# Patient Record
Sex: Male | Born: 1960 | Race: Black or African American | Hispanic: No | Marital: Married | Smoking: Current every day smoker
Health system: Southern US, Community
[De-identification: ages and names within clinical notes are randomized; demographics above are authoritative.]

---

## 1999-10-28 ENCOUNTER — Emergency Department (HOSPITAL_COMMUNITY): Admission: EM | Admit: 1999-10-28 | Discharge: 1999-10-28 | Payer: Self-pay

## 2015-12-30 ENCOUNTER — Ambulatory Visit (INDEPENDENT_AMBULATORY_CARE_PROVIDER_SITE_OTHER): Payer: BLUE CROSS/BLUE SHIELD | Admitting: Sports Medicine

## 2015-12-30 ENCOUNTER — Ambulatory Visit (INDEPENDENT_AMBULATORY_CARE_PROVIDER_SITE_OTHER): Payer: BLUE CROSS/BLUE SHIELD

## 2015-12-30 ENCOUNTER — Encounter: Payer: Self-pay | Admitting: Sports Medicine

## 2015-12-30 VITALS — BP 125/74 | HR 106 | Resp 16 | Wt 208.0 lb

## 2015-12-30 DIAGNOSIS — S82142A Displaced bicondylar fracture of left tibia, initial encounter for closed fracture: Secondary | ICD-10-CM

## 2015-12-30 DIAGNOSIS — S82145A Nondisplaced bicondylar fracture of left tibia, initial encounter for closed fracture: Secondary | ICD-10-CM | POA: Diagnosis not present

## 2015-12-30 DIAGNOSIS — X58XXXA Exposure to other specified factors, initial encounter: Secondary | ICD-10-CM

## 2015-12-30 DIAGNOSIS — M25462 Effusion, left knee: Secondary | ICD-10-CM

## 2015-12-30 DIAGNOSIS — M25561 Pain in right knee: Secondary | ICD-10-CM

## 2015-12-30 MED ORDER — MELOXICAM 15 MG PO TABS
ORAL_TABLET | ORAL | Status: DC
Start: 2015-12-30 — End: 2016-05-08

## 2015-12-30 MED ORDER — AMBULATORY NON FORMULARY MEDICATION: Status: AC

## 2015-12-30 NOTE — Assessment & Plan Note (Addendum)
After an injury while deep see fishing. I am unable to get solid endpoints on his LCL, anterior cruciate ligament, PCL. Aspiration and injection, x-rays, return in one month. Meloxicam.  Noted tibial plateau fracture, advised against weight-bearing, he will get crutches and a rolling knee scooter.

## 2015-12-30 NOTE — Progress Notes (Signed)
   Subjective:    I'm seeing this patient as a consultation for:  Dr. Pecolia Adesimothy Dalton  CC: Left knee swelling  HPI: This is a pleasant 55 year old male, he went on a deep sea fishing trip and injured his knee, accidentally hit it on something and thinks he may hyperextended it. He had immediate pain, severe swelling. Right now pain is predominantly at the medial and lateral joint lines, he does have a sense of instability. Pain is moderate, persistent.  Past medical history, Surgical history, Family history not pertinant except as noted below, Social history, Allergies, and medications have been entered into the medical record, reviewed, and no changes needed.   Review of Systems: No headache, visual changes, nausea, vomiting, diarrhea, constipation, dizziness, abdominal pain, skin rash, fevers, chills, night sweats, weight loss, swollen lymph nodes, body aches, joint swelling, muscle aches, chest pain, shortness of breath, mood changes, visual or auditory hallucinations.   Objective:   General: Well Developed, well nourished, and in no acute distress.  Neuro/Psych: Alert and oriented x3, extra-ocular muscles intact, able to move all 4 extremities, sensation grossly intact. Skin: Warm and dry, no rashes noted.  Respiratory: Not using accessory muscles, speaking in full sentences, trachea midline.  Cardiovascular: Pulses palpable, no extremity edema. Abdomen: Does not appear distended. Left Knee: Visibly swollen with a palpable effusion and tenderness at the medial and lateral joint lines ROM normal in flexion and extension and lower leg rotation. Ligaments with solid consistent endpoints including the only ligament I can get a good endpoint for is the MCL. Negative Mcmurray's and provocative meniscal tests. Non painful patellar compression. Patellar and quadriceps tendons unremarkable. Hamstring and quadriceps strength is normal.  Procedure: Real-time Ultrasound Guided  aspiration/Injection of left knee Device: GE Logiq E  Verbal informed consent obtained.  Time-out conducted.  Noted no overlying erythema, induration, or other signs of local infection.  Skin prepped in a sterile fashion.  Local anesthesia: Topical Ethyl chloride.  With sterile technique and under real time ultrasound guidance: Aspirated 57 mL frank blood, syringe switched and 1 mL kenalog 40, 2 mL lidocaine, 2 mL Marcaine injected easily.  Completed without difficulty  Pain immediately resolved suggesting accurate placement of the medication.  Advised to call if fevers/chills, erythema, induration, drainage, or persistent bleeding.  Images permanently stored and available for review in the ultrasound unit.  Impression: Technically successful ultrasound guided injection.  Impression and Recommendations:   This case required medical decision making of moderate complexity.

## 2016-01-15 ENCOUNTER — Encounter: Payer: Self-pay | Admitting: Sports Medicine

## 2016-01-27 ENCOUNTER — Encounter: Payer: Self-pay | Admitting: Sports Medicine

## 2016-01-27 ENCOUNTER — Ambulatory Visit (INDEPENDENT_AMBULATORY_CARE_PROVIDER_SITE_OTHER): Payer: BLUE CROSS/BLUE SHIELD

## 2016-01-27 ENCOUNTER — Ambulatory Visit (INDEPENDENT_AMBULATORY_CARE_PROVIDER_SITE_OTHER): Payer: BLUE CROSS/BLUE SHIELD | Admitting: Sports Medicine

## 2016-01-27 DIAGNOSIS — S82142D Displaced bicondylar fracture of left tibia, subsequent encounter for closed fracture with routine healing: Secondary | ICD-10-CM

## 2016-01-27 DIAGNOSIS — S82142A Displaced bicondylar fracture of left tibia, initial encounter for closed fracture: Secondary | ICD-10-CM

## 2016-01-27 DIAGNOSIS — W2203XD Walked into furniture, subsequent encounter: Secondary | ICD-10-CM

## 2016-01-27 NOTE — Progress Notes (Signed)
  Subjective:    CC: Follow-up  HPI: Left tibial plateau fracture: Improving significantly after initial injury. Can weight bear with only minimal discomfort.  Past medical history, Surgical history, Family history not pertinant except as noted below, Social history, Allergies, and medications have been entered into the medical record, reviewed, and no changes needed.   Review of Systems: No fevers, chills, night sweats, weight loss, chest pain, or shortness of breath.   Objective:    General: Well Developed, well nourished, and in no acute distress.  Neuro: Alert and oriented x3, extra-ocular muscles intact, sensation grossly intact.  HEENT: Normocephalic, atraumatic, pupils equal round reactive to light, neck supple, no masses, no lymphadenopathy, thyroid nonpalpable.  Skin: Warm and dry, no rashes. Cardiac: Regular rate and rhythm, no murmurs rubs or gallops, no lower extremity edema.  Respiratory: Clear to auscultation bilaterally. Not using accessory muscles, speaking in full sentences. Left knee: Still with some minor effusion, only minimal tenderness at the lateral tibial plateau, some laxity to the MCL and LCL.  Impression and Recommendations:    I spent 25 minutes with this patient, greater than 50% was face-to-face time counseling regarding the above diagnoses

## 2016-01-27 NOTE — Assessment & Plan Note (Signed)
1 month post tibial plateau fracture, lateral. We did place some steroid in his joint at the last visit. Repeat x-rays today, return to see me in one month. Continue brace, out of work for another month.

## 2016-02-24 ENCOUNTER — Ambulatory Visit: Payer: BLUE CROSS/BLUE SHIELD | Admitting: Sports Medicine

## 2016-02-25 ENCOUNTER — Encounter: Payer: Self-pay | Admitting: Sports Medicine

## 2016-02-25 ENCOUNTER — Ambulatory Visit (INDEPENDENT_AMBULATORY_CARE_PROVIDER_SITE_OTHER): Payer: BLUE CROSS/BLUE SHIELD | Admitting: Sports Medicine

## 2016-02-25 ENCOUNTER — Ambulatory Visit (INDEPENDENT_AMBULATORY_CARE_PROVIDER_SITE_OTHER): Payer: BLUE CROSS/BLUE SHIELD

## 2016-02-25 DIAGNOSIS — S82142D Displaced bicondylar fracture of left tibia, subsequent encounter for closed fracture with routine healing: Secondary | ICD-10-CM

## 2016-02-25 DIAGNOSIS — X58XXXD Exposure to other specified factors, subsequent encounter: Secondary | ICD-10-CM

## 2016-02-25 DIAGNOSIS — M1711 Unilateral primary osteoarthritis, right knee: Secondary | ICD-10-CM

## 2016-02-25 NOTE — Progress Notes (Signed)
  Subjective:    CC: Follow-up  HPI: Just over 6 weeks post left tibial plateau fracture, doing better, and continues to improve.  Past medical history, Surgical history, Family history not pertinant except as noted below, Social history, Allergies, and medications have been entered into the medical record, reviewed, and no changes needed.   Review of Systems: No fevers, chills, night sweats, weight loss, chest pain, or shortness of breath.   Objective:    General: Well Developed, well nourished, and in no acute distress.  Neuro: Alert and oriented x3, extra-ocular muscles intact, sensation grossly intact.  HEENT: Normocephalic, atraumatic, pupils equal round reactive to light, neck supple, no masses, no lymphadenopathy, thyroid nonpalpable.  Skin: Warm and dry, no rashes. Cardiac: Regular rate and rhythm, no murmurs rubs or gallops, no lower extremity edema.  Respiratory: Clear to auscultation bilaterally. Not using accessory muscles, speaking in full sentences. Left Knee: Normal to inspection with no erythema or effusion or obvious bony abnormalities. Still with a bit of tenderness over the medial tibial plateau ROM normal in flexion and extension and lower leg rotation. Ligaments with solid consistent endpoints including ACL, PCL, LCL, MCL. Negative Mcmurray's and provocative meniscal tests. Non painful patellar compression. Patellar and quadriceps tendons unremarkable. Hamstring and quadriceps strength is normal.  Impression and Recommendations:    I spent 25 minutes with this patient, greater than 50% was face-to-face time counseling regarding the above diagnoses

## 2016-02-25 NOTE — Assessment & Plan Note (Signed)
Continues to improve. Still has some pain, and a bit of varus instability. Repeat x-rays today, we did discuss the likelihood of post traumatic osteoarthritis. Continue brace. Continue meloxicam. Return to see me in one month, out of work for 2 additional weeks. If persistent pain we will proceed with MRI

## 2016-03-24 ENCOUNTER — Ambulatory Visit (INDEPENDENT_AMBULATORY_CARE_PROVIDER_SITE_OTHER): Payer: BLUE CROSS/BLUE SHIELD | Admitting: Sports Medicine

## 2016-03-24 ENCOUNTER — Encounter: Payer: Self-pay | Admitting: Sports Medicine

## 2016-03-24 DIAGNOSIS — M1712 Unilateral primary osteoarthritis, left knee: Secondary | ICD-10-CM

## 2016-03-24 NOTE — Progress Notes (Signed)
  Subjective:    CC: Followup  HPI: This is a pleasant 55 year old male, he is a couple of months from a tibial plateau fracture, doing extremely well. Essentially has no pain but continues to wear his brace. Still has some pain anteriorly worse with going up and down stairs.  Past medical history, Surgical history, Family history not pertinant except as noted below, Social history, Allergies, and medications have been entered into the medical record, reviewed, and no changes needed.   Review of Systems: No fevers, chills, night sweats, weight loss, chest pain, or shortness of breath.   Objective:    General: Well Developed, well nourished, and in no acute distress.  Neuro: Alert and oriented x3, extra-ocular muscles intact, sensation grossly intact.  HEENT: Normocephalic, atraumatic, pupils equal round reactive to light, neck supple, no masses, no lymphadenopathy, thyroid nonpalpable.  Skin: Warm and dry, no rashes. Cardiac: Regular rate and rhythm, no murmurs rubs or gallops, no lower extremity edema.  Respiratory: Clear to auscultation bilaterally. Not using accessory muscles, speaking in full sentences. Left Knee: Normal to inspection with no erythema or effusion or obvious bony abnormalities. Palpation normal with no warmth or joint line tenderness or patellar tenderness or condyle tenderness. ROM normal in flexion and extension and lower leg rotation. Ligaments with solid consistent endpoints including ACL, PCL, LCL, MCL. Negative Mcmurray's and provocative meniscal tests. Non painful patellar compression. Patellar and quadriceps tendons unremarkable. Hamstring and quadriceps strength is normal.  Impression and Recommendations:    Primary osteoarthritis of left knee Overall pain-free right now after steroid injection, he does have a tibial plateau fracture which will likely lead to some progressive osteoarthritis. He is off of the meloxicam and pain-free, if pain recurs he will  continue the moment. Pain is predominantly patellofemoral this point so adding patellofemoral rehabilitation exercises. Information given to him about Visco supplementation which would be the next step if he has insufficient improvement, he will call first so that we can work on approval.

## 2016-03-24 NOTE — Assessment & Plan Note (Signed)
Overall pain-free right now after steroid injection, he does have a tibial plateau fracture which will likely lead to some progressive osteoarthritis. He is off of the meloxicam and pain-free, if pain recurs he will continue the moment. Pain is predominantly patellofemoral this point so adding patellofemoral rehabilitation exercises. Information given to him about Visco supplementation which would be the next step if he has insufficient improvement, he will call first so that we can work on approval.

## 2016-03-24 NOTE — Patient Instructions (Signed)
Followup in 6 weeks but call Arthur Fry a week before to let her know to get you set up for viscosupplementation/artificial joint fluid.

## 2016-05-01 ENCOUNTER — Telehealth: Payer: Self-pay | Admitting: Internal Medicine

## 2016-05-01 NOTE — Telephone Encounter (Signed)
Pt called clinic stating he was ready to being the process for OV approval. Submitted for approval on Orthovisc, left knee. Awaiting confirmation.

## 2016-05-04 MED ORDER — SODIUM HYALURONATE (VISCOSUP) 20 MG/2ML IX SOSY: 1.0000 | PREFILLED_SYRINGE | INTRA_ARTICULAR | 0 refills | Status: DC

## 2016-05-04 NOTE — Addendum Note (Signed)
Addended by: Monica BectonHEKKEKANDAM, Caily Rakers J on: 05/04/2016 12:03 PM   Modules accepted: Orders

## 2016-05-04 NOTE — Telephone Encounter (Addendum)
Sending in [facility] to [facility].

## 2016-05-04 NOTE — Telephone Encounter (Signed)
Received the following information from OV benefits investigation:   Patient has a Self Funded PPO plan with an effective date of 08/11/2015. Note: Orthovisc is not the preferred drug through [facility] and will not be covered until patient has tried and failed with the preferred drug. Pre-cert would be required after trying and failing with preferred, Pre-cert phone # is [phone]). [id number] is covered at 80% & [id number] is covered at 80% & [id number] is covered at 80% Office visits are covered at 80% of the contracted rate when performed in an office setting. *Deductible must be met for coverage to apply. If Out of Pocket is met, Coverage goes to 100%. Ref# [id number]   Will route to Provider for preferred injection to be sent to [place]. Pt aware of change and will be expecting phone call from pharmacy.

## 2016-05-05 ENCOUNTER — Ambulatory Visit: Payer: BLUE CROSS/BLUE SHIELD | Admitting: Sports Medicine

## 2016-05-08 ENCOUNTER — Other Ambulatory Visit: Payer: Self-pay | Admitting: *Deleted

## 2016-05-08 DIAGNOSIS — M25462 Effusion, left knee: Secondary | ICD-10-CM

## 2016-05-08 MED ORDER — MELOXICAM 15 MG PO TABS: ORAL_TABLET | ORAL | 0 refills | Status: DC

## 2016-05-08 MED ORDER — MELOXICAM 15 MG PO TABS: ORAL_TABLET | ORAL | 0 refills | Status: AC

## 2016-05-08 NOTE — Progress Notes (Signed)
Refill request for # 90 of meloxicam

## 2016-05-08 NOTE — Progress Notes (Signed)
Request was from [facility] for 90 days supply. Sent refill to [facility] by accident. Called and canceled rx refill at [facility] and  [facility]

## 2016-05-27 ENCOUNTER — Encounter: Payer: Self-pay | Admitting: Sports Medicine

## 2016-05-27 ENCOUNTER — Ambulatory Visit (INDEPENDENT_AMBULATORY_CARE_PROVIDER_SITE_OTHER): Payer: BLUE CROSS/BLUE SHIELD | Admitting: Sports Medicine

## 2016-05-27 DIAGNOSIS — M1712 Unilateral primary osteoarthritis, left knee: Secondary | ICD-10-CM

## 2016-05-27 MED ORDER — SODIUM HYALURONATE (VISCOSUP) 20 MG/2ML IX SOSY: 1.0000 | PREFILLED_SYRINGE | INTRA_ARTICULAR | 0 refills | Status: AC

## 2016-05-27 NOTE — Progress Notes (Signed)
  Subjective:    CC: Left knee swelling  HPI:  This is a pleasant 55 year old male, for the past several weeks he's had increasing pain, pressure, swelling of his left knee, moderate, persistent, no mechanical symptoms, no constitutional symptoms, no trauma, we injected him about 3-4 months ago, he did have a tibial plateau fracture and osteoarthritis. He never followed through with viscous supplementation.  Past medical history:  Negative.  See flowsheet/record as well for more information.  Surgical history: Negative.  See flowsheet/record as well for more information.  Family history: Negative.  See flowsheet/record as well for more information.  Social history: Negative.  See flowsheet/record as well for more information.  Allergies, and medications have been entered into the medical record, reviewed, and no changes needed.   Review of Systems: No fevers, chills, night sweats, weight loss, chest pain, or shortness of breath.   Objective:    General: Well Developed, well nourished, and in no acute distress.  Neuro: Alert and oriented x3, extra-ocular muscles intact, sensation grossly intact.  HEENT: Normocephalic, atraumatic, pupils equal round reactive to light, neck supple, no masses, no lymphadenopathy, thyroid nonpalpable.  Skin: Warm and dry, no rashes. Cardiac: Regular rate and rhythm, no murmurs rubs or gallops, no lower extremity edema.  Respiratory: Clear to auscultation bilaterally. Not using accessory muscles, speaking in full sentences. Left knee: Visibly swollen with a palpable fluid wave, effusion, and tenderness at the medial joint line ROM normal in flexion and extension and lower leg rotation. Ligaments with solid consistent endpoints including ACL, PCL, LCL, MCL. Negative Mcmurray's and provocative meniscal tests. Non painful patellar compression. Patellar and quadriceps tendons unremarkable. Hamstring and quadriceps strength is normal.  Procedure: Real-time  Ultrasound Guided aspiration/Injection of left knee Device: GE Logiq E  Verbal informed consent obtained.  Time-out conducted.  Noted no overlying erythema, induration, or other signs of local infection.  Skin prepped in a sterile fashion.  Local anesthesia: Topical Ethyl chloride.  With sterile technique and under real time ultrasound guidance:  Aspirated 73 mL straw-colored fluid, syringe switched and 1 mL kenalog 40, 2 mL lidocaine, 2 mL Marcaine injected easily. Completed without difficulty  Pain immediately resolved suggesting accurate placement of the medication.  Advised to call if fevers/chills, erythema, induration, drainage, or persistent bleeding.  Images permanently stored and available for review in the ultrasound unit.  Impression: Technically successful ultrasound guided injection.  Impression and Recommendations:    Primary osteoarthritis of left knee Aspiration and injection as above. We are going to send off his Euflexxa again, he will come in when we get his shots.

## 2016-05-27 NOTE — Assessment & Plan Note (Signed)
Aspiration and injection as above. We are going to send off his Euflexxa again, he will come in when we get his shots.

## 2016-07-20 ENCOUNTER — Other Ambulatory Visit: Payer: Self-pay | Admitting: Sports Medicine

## 2016-07-20 ENCOUNTER — Encounter: Payer: Self-pay | Admitting: Sports Medicine

## 2016-07-20 DIAGNOSIS — M25462 Effusion, left knee: Secondary | ICD-10-CM

## 2017-07-29 IMAGING — DX DG KNEE COMPLETE 4+V*L*
4 series · 4 of 4 positions shown · non-contrast
Comparison: 12/30/2015

CLINICAL DATA: LEFT tibial plateau fracture, follow-up, non
weight-bearing for 1 month, some pain and tenderness

EXAM:
LEFT KNEE - COMPLETE 4+ VIEW

[knee ap]
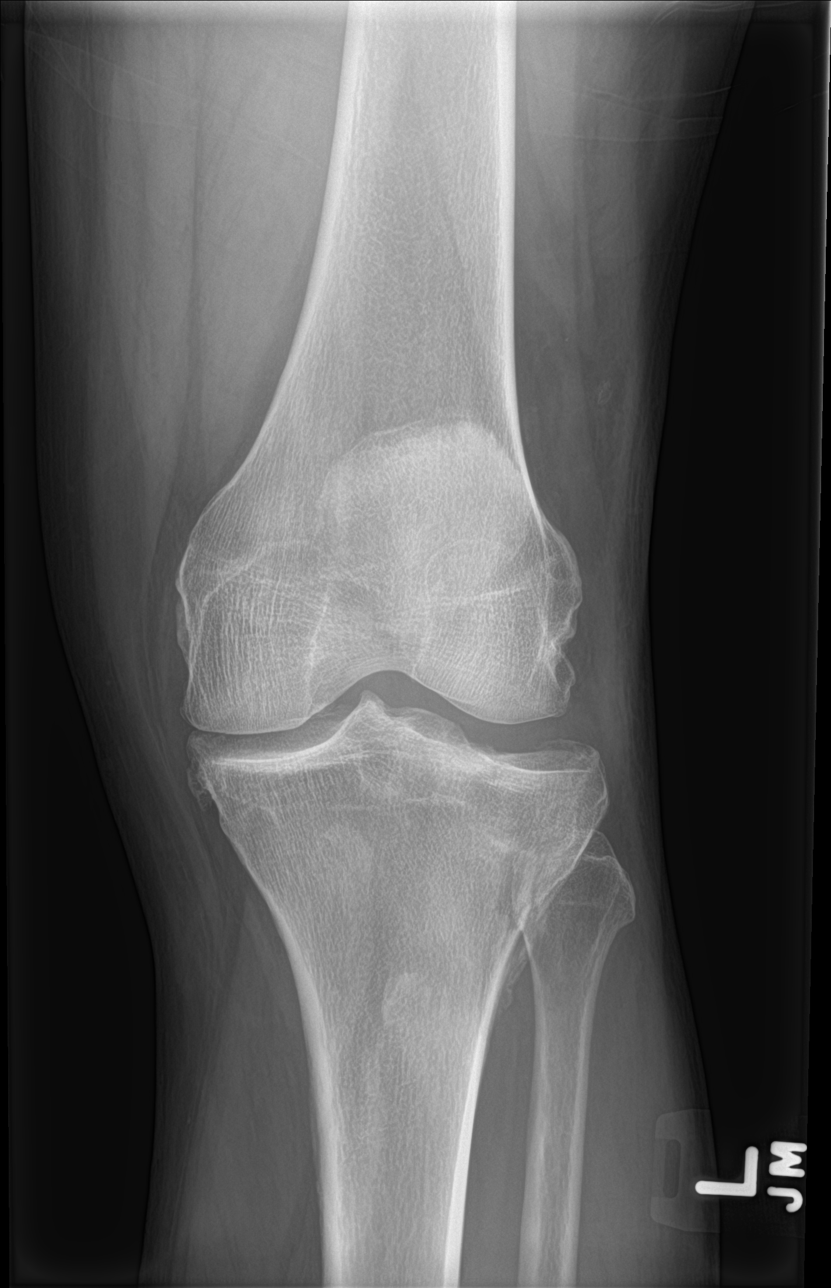

[knee lat]
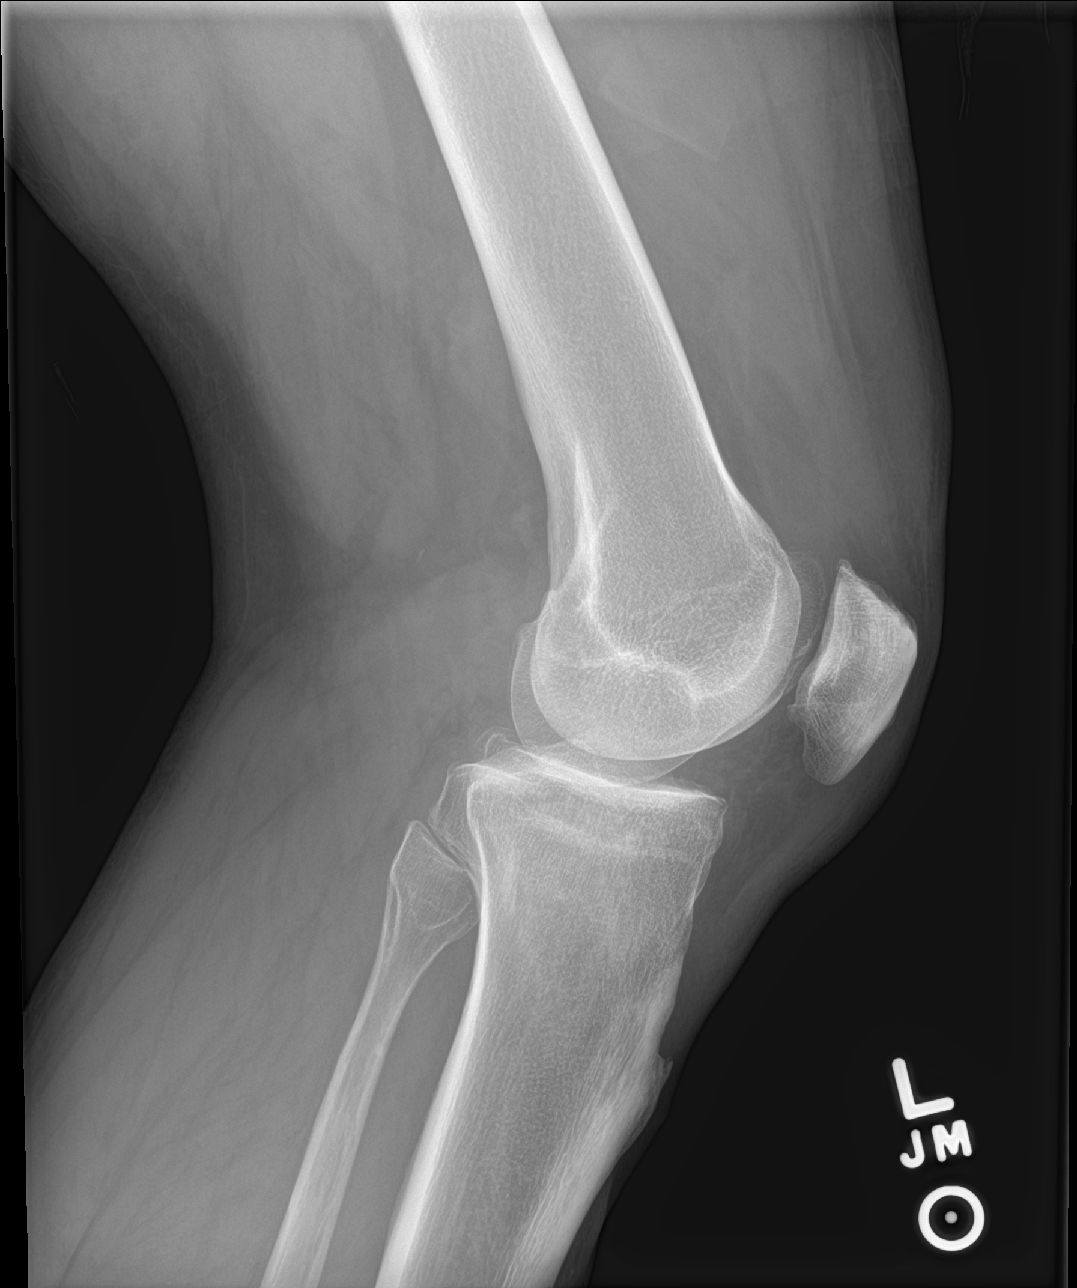

[knee obl (1 of 2)]
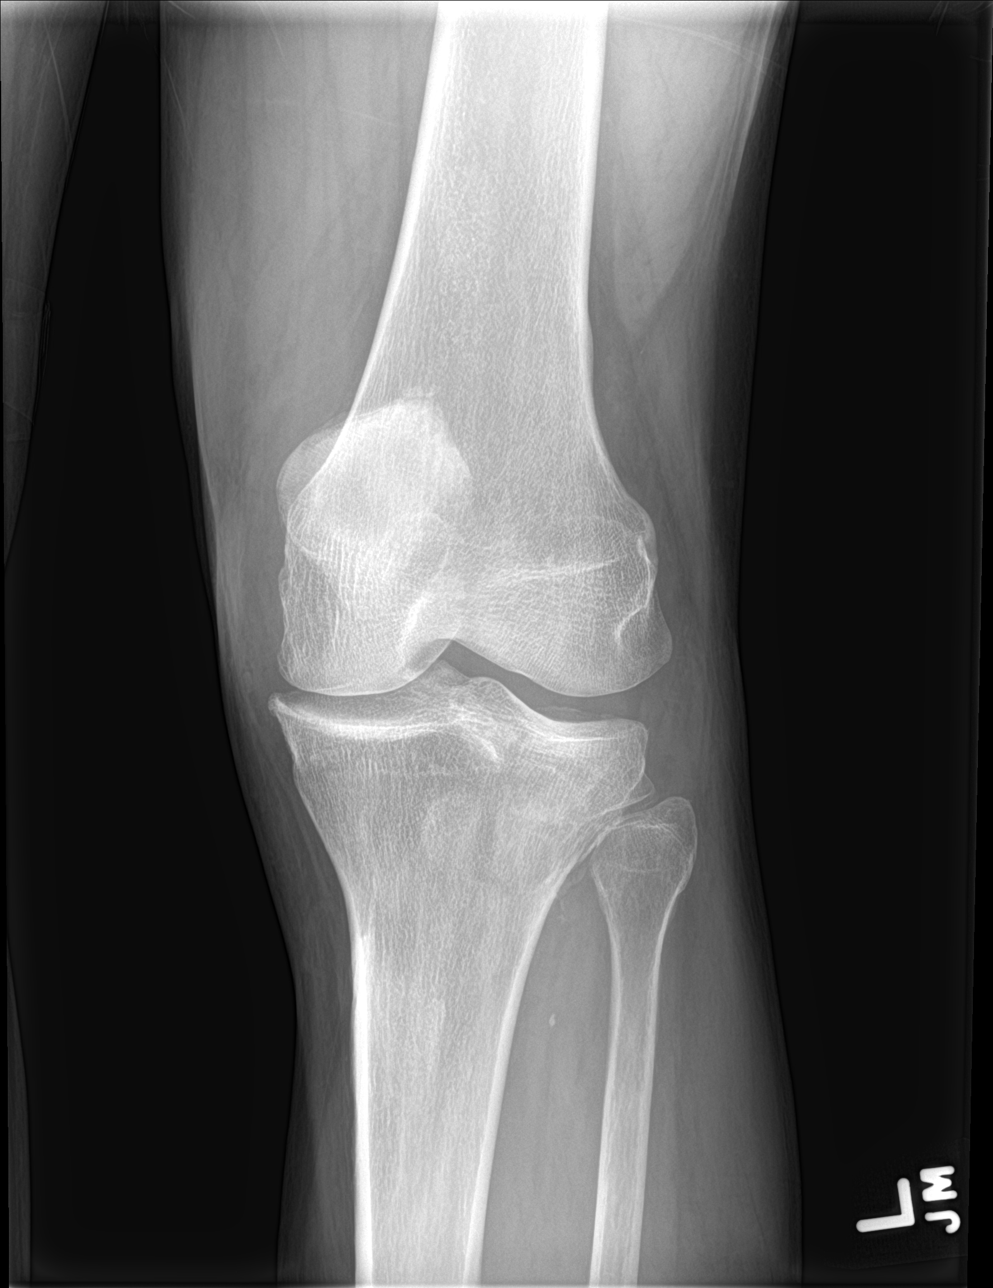

[knee obl (2 of 2)]
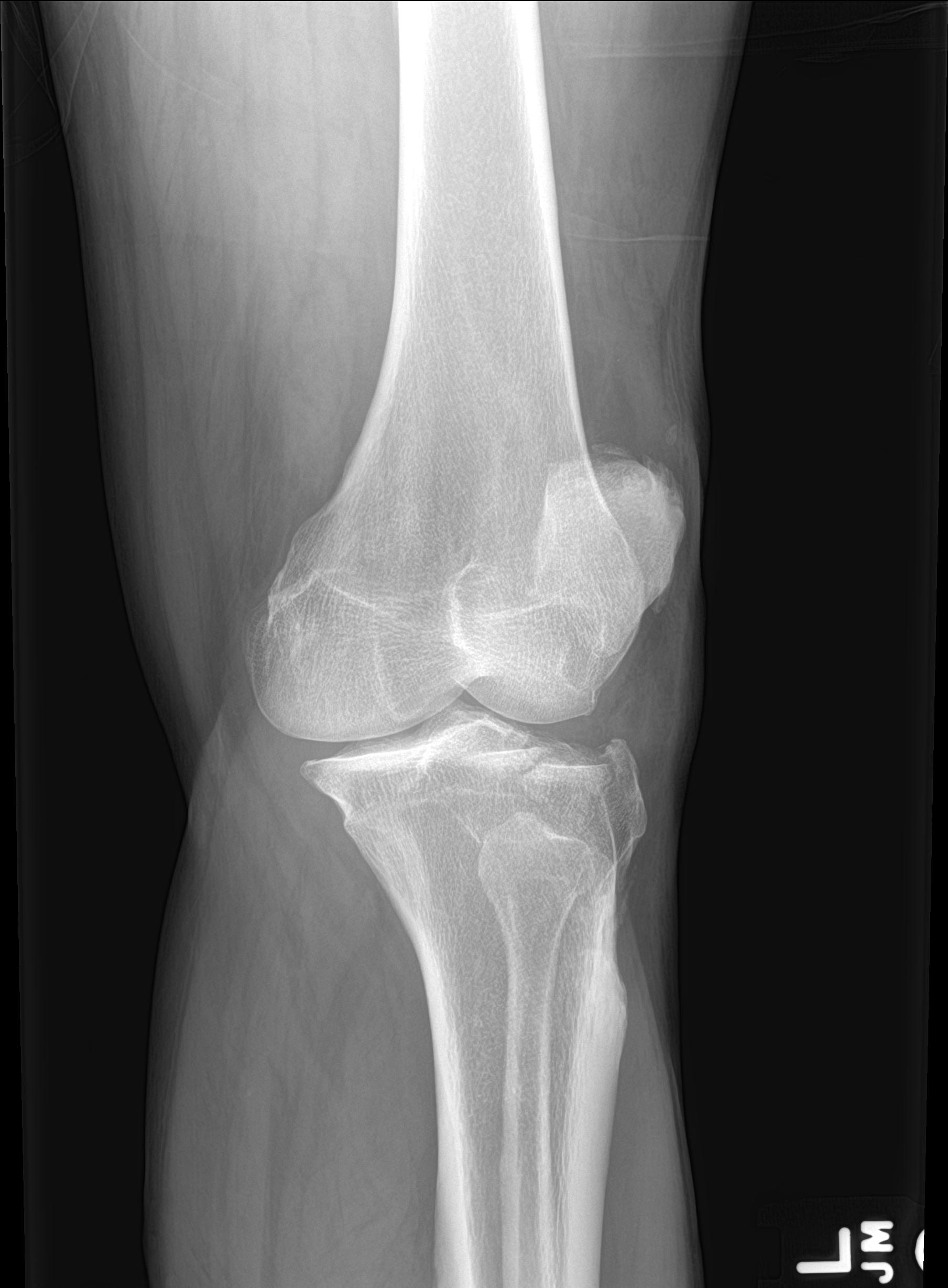

[4 of 4 positions shown; findings below may reference images not displayed]

FINDINGS: Osseous demineralization.

Joint space narrowing with marginal spur formation at medial
compartment.

Depressed fracture lateral LEFT tibial plateau with callus formation
noted at the lateral metaphyseal margin.

A dominant articular fracture fragment appears depressed 5 mm.

No additional fracture, dislocation, or bone destruction.

No significant joint effusion.
IMPRESSION: Mildly depressed fracture at the lateral LEFT tibial plateau with
evidence of some interval healing/callus formation.

## 2017-08-27 IMAGING — DX DG KNEE COMPLETE 4+V*L*
4 series · 4 of 4 positions shown · non-contrast
Comparison: Knee films of 01/27/2016 and 12/30/2015

CLINICAL DATA: Followup of left lateral tibial plateau fracture

EXAM:
LEFT KNEE - COMPLETE 4+ VIEW

[knee lat]
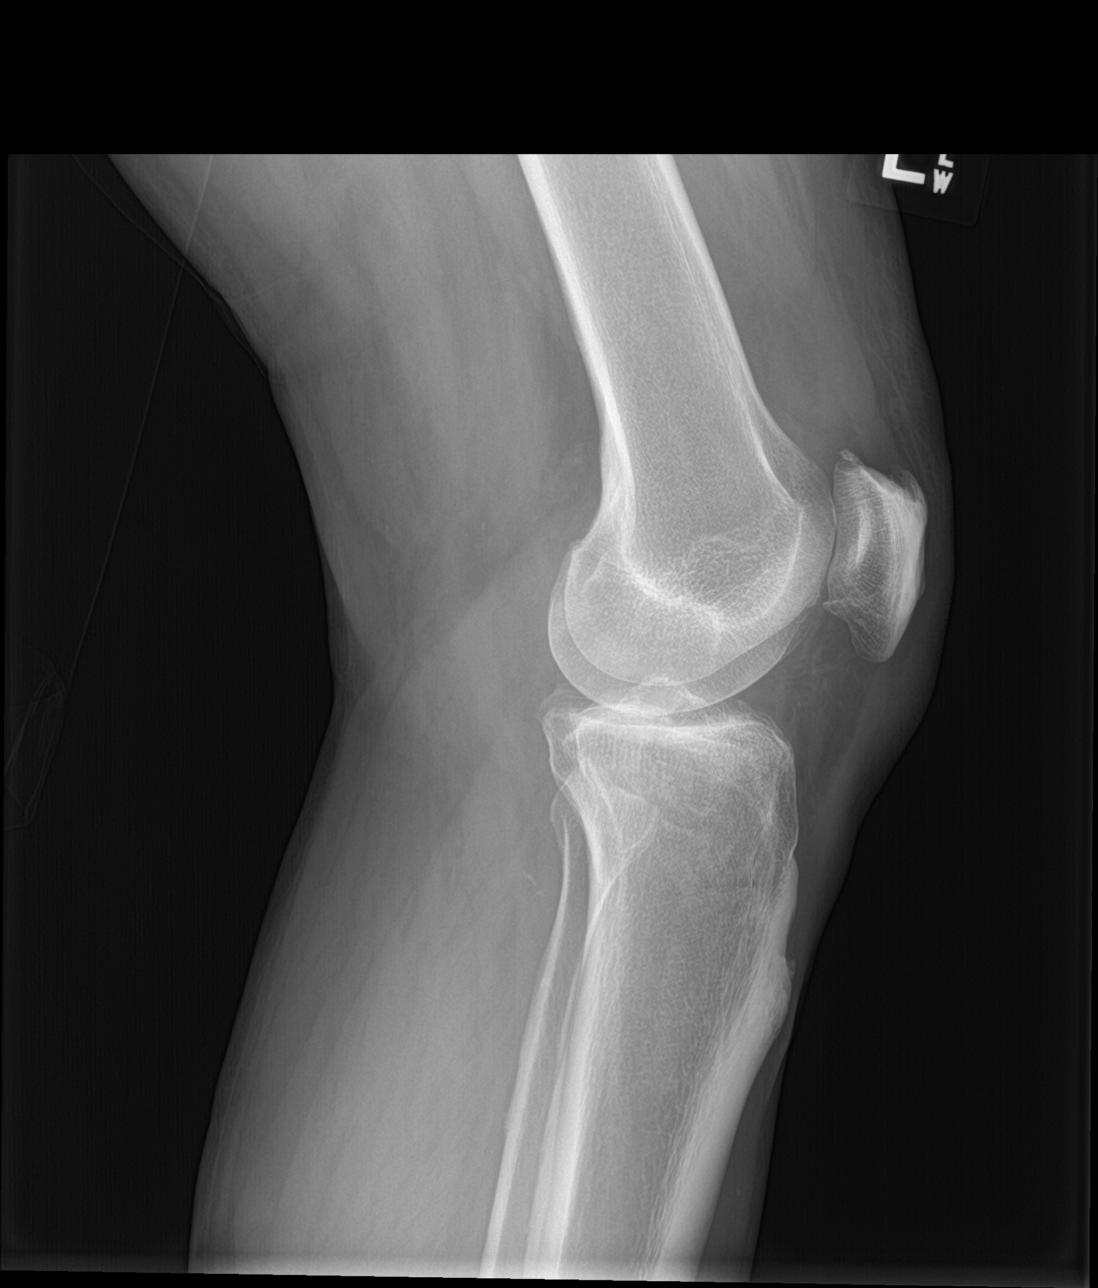

[knee sunrise]
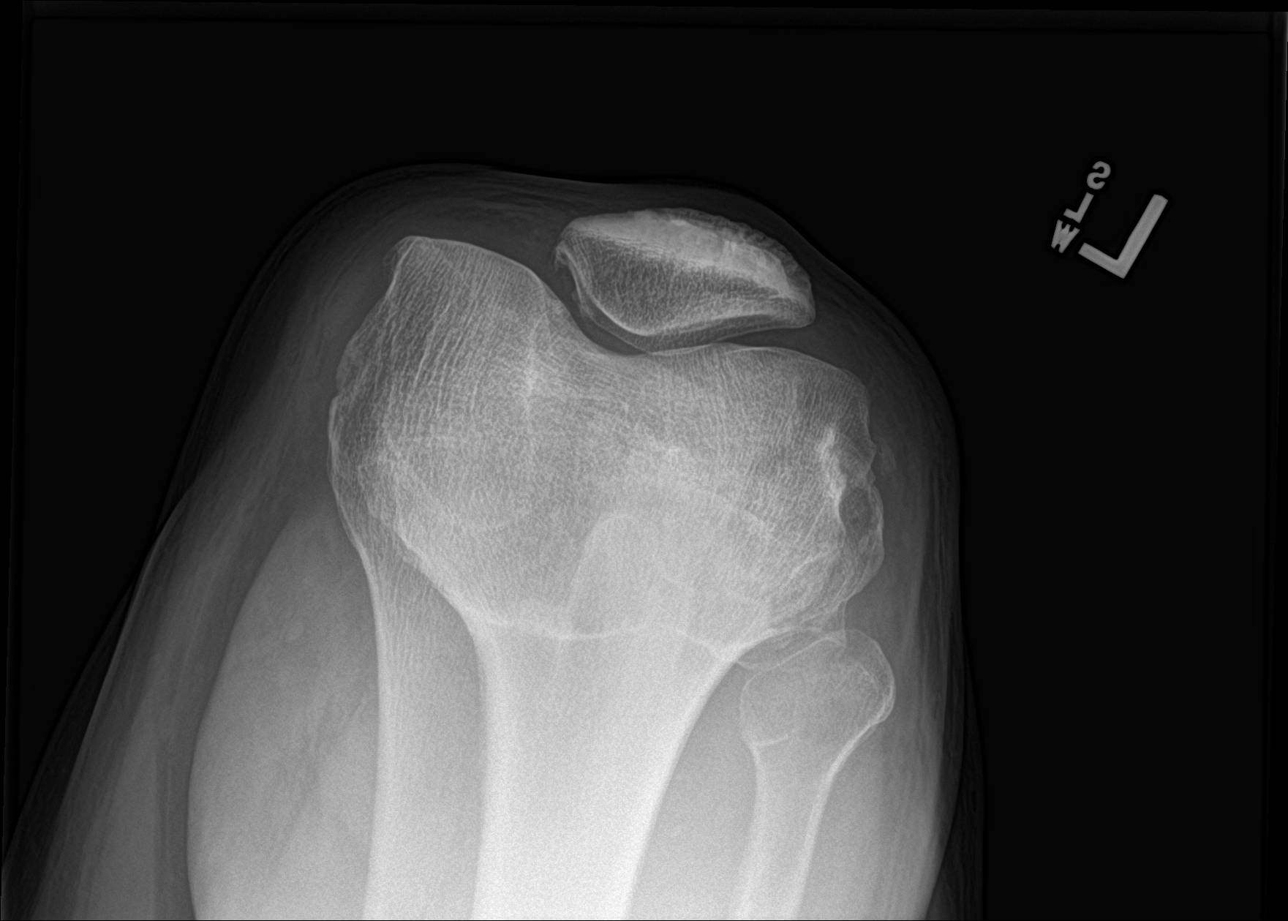

[knee ap bilat standing (1 of 2)]
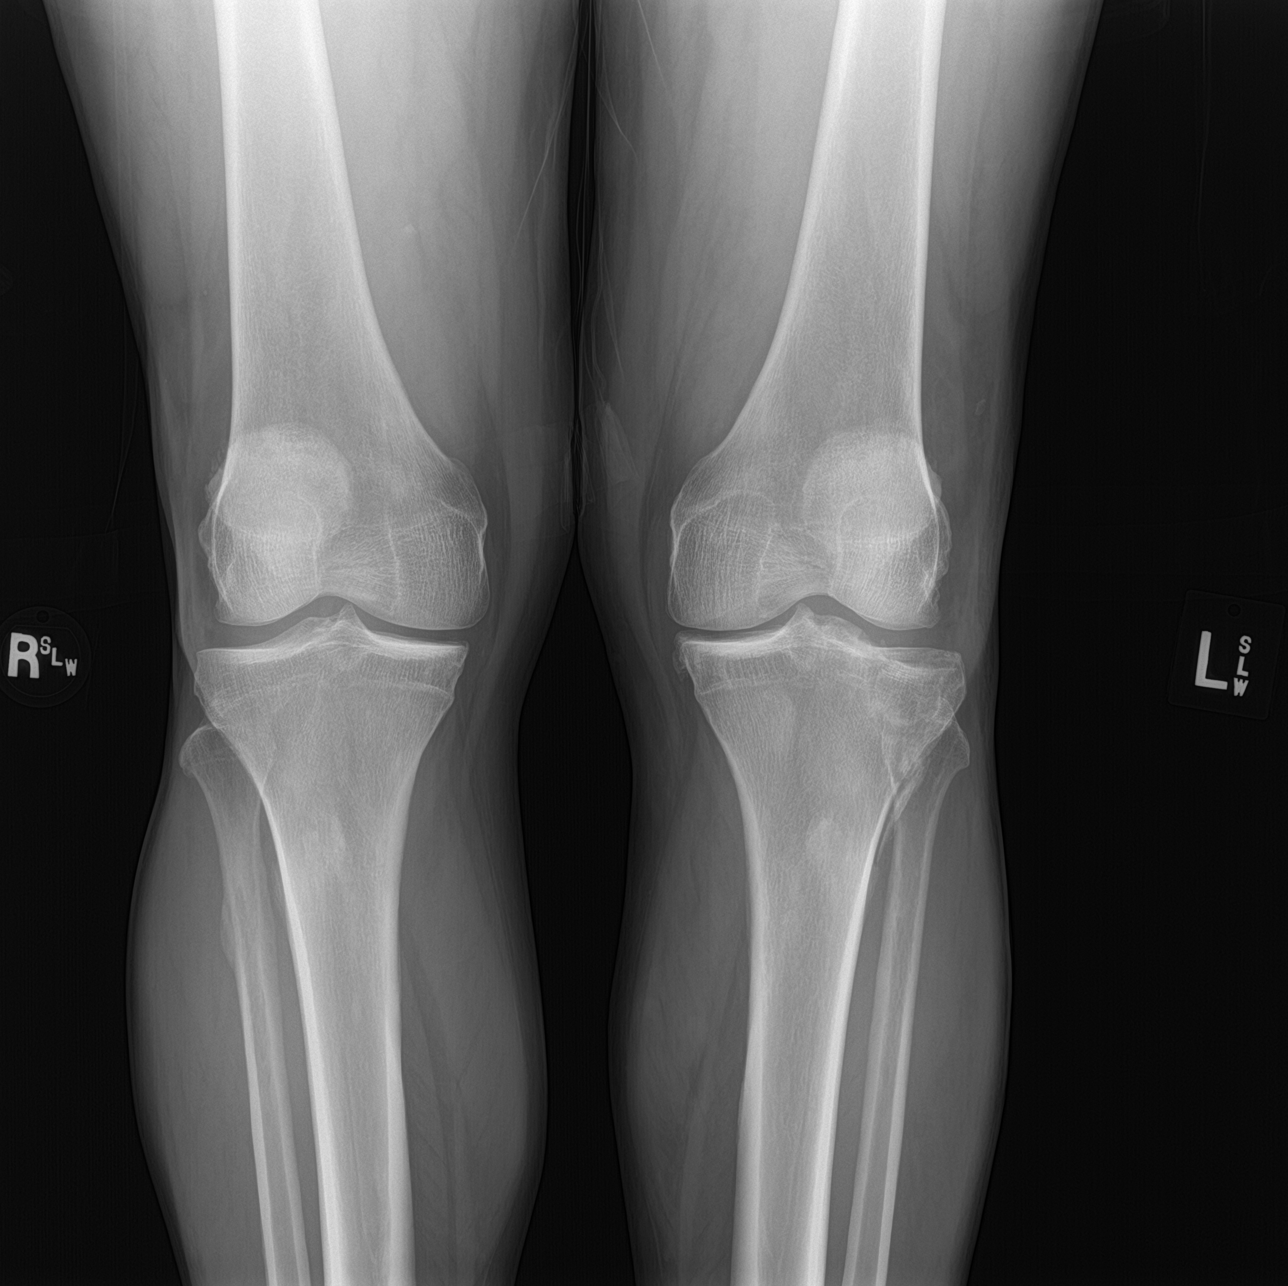

[knee ap bilat standing (2 of 2)]
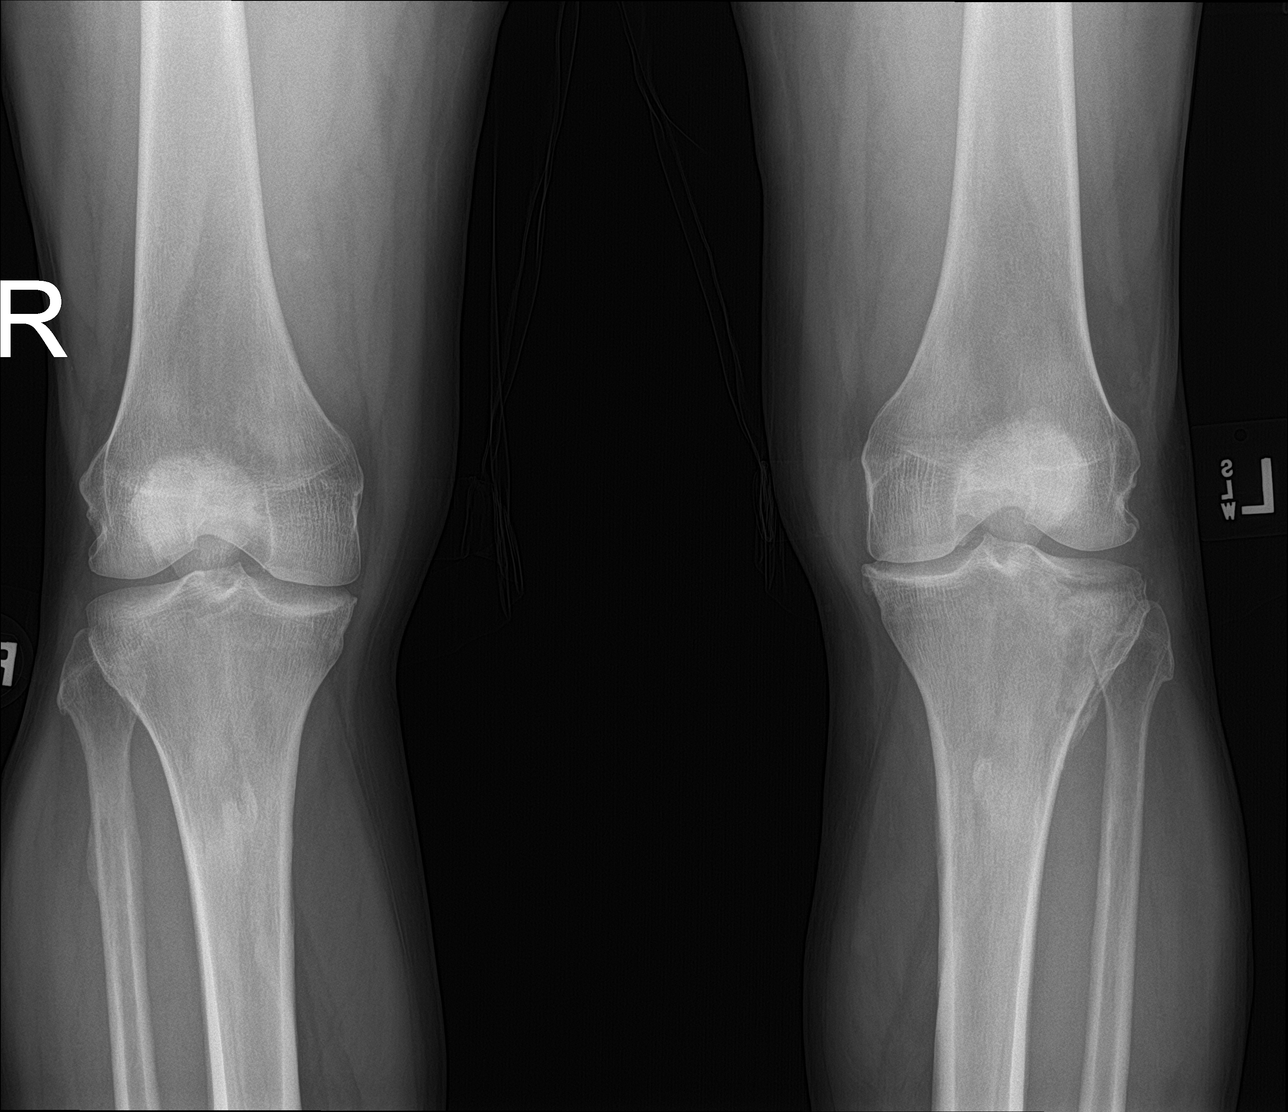

[4 of 4 positions shown; findings below may reference images not displayed]

FINDINGS: The slightly depressed left lateral tibial plateau fracture is
unchanged. There is still some lucency along the fracture line
extending vertically and caudally. There is degenerative joint
disease involving the medial and patellofemoral compartments of the
left knee as well. No joint effusion is seen.
IMPRESSION: The slightly depressed left lateral tibial plateau fracture is
unchanged, with the linear fracture line still visible. No joint
effusion.
